# Patient Record
Sex: Male | Born: 1995 | Race: Black or African American | Hispanic: No | Marital: Single | Smoking: Current every day smoker
Health system: Southern US, Community
[De-identification: ages and names within clinical notes are randomized; demographics above are authoritative.]

---

## 2018-06-25 ENCOUNTER — Emergency Department
Admission: EM | Admit: 2018-06-25 | Discharge: 2018-06-25 | Disposition: A | Discharge status: Home / Self Care | Payer: HRSA Program | Attending: Emergency Medicine | Admitting: Emergency Medicine

## 2018-06-25 ENCOUNTER — Other Ambulatory Visit: Payer: Self-pay

## 2018-06-25 ENCOUNTER — Encounter: Payer: Self-pay | Admitting: *Deleted

## 2018-06-25 DIAGNOSIS — F17200 Nicotine dependence, unspecified, uncomplicated: Secondary | ICD-10-CM | POA: Diagnosis not present

## 2018-06-25 DIAGNOSIS — Z139 Encounter for screening, unspecified: Secondary | ICD-10-CM

## 2018-06-25 DIAGNOSIS — Z03818 Encounter for observation for suspected exposure to other biological agents ruled out: Secondary | ICD-10-CM | POA: Insufficient documentation

## 2018-06-25 LAB — SARS CORONAVIRUS 2 BY RT PCR (HOSPITAL ORDER, PERFORMED IN ~~LOC~~ HOSPITAL LAB): SARS Coronavirus 2: NEGATIVE

## 2018-06-25 NOTE — ED Triage Notes (Signed)
Pt reports being in contact with a positive covid pt.  Pt want to be checked.  Pt reports no fever or cough.  Pt alert and ambulatory to room

## 2018-06-27 NOTE — ED Provider Notes (Signed)
[facility] Emergency Department Provider Note    First MD Initiated Contact with Patient 06/25/18 1559     (approximate)  I have reviewed the triage vital signs and the nursing notes.   HISTORY  Chief Complaint Cough    HPI Thomas Roberson is a 23 y.o. male presents to the emergency department with request for testing for COVID-19.  Patient states that he was in contact with someone that was positive for COVID-19 and that his job is requiring him to be tested before he can return to work.  Patient denies any cough no fever.  Patient denies any shortness of breath.  Patient denies any complaints at all.        No past medical history on file.  There are no active problems to display for this patient.    Prior to Admission medications   Not on File    Allergies Patient has no known allergies.  No family history on file.  Social History Social History   Tobacco Use  . Smoking status: Current Every Day Smoker  . Smokeless tobacco: Never Used  Substance Use Topics  . Alcohol use: Yes  . Drug use: Not Currently    Review of Systems Constitutional: No fever/chills Eyes: No visual changes. ENT: No sore throat. Cardiovascular: Denies chest pain. Respiratory: Denies shortness of breath. Gastrointestinal: No abdominal pain.  No nausea, no vomiting.  No diarrhea.  No constipation. Genitourinary: Negative for dysuria. Musculoskeletal: Negative for neck pain.  Negative for back pain. Integumentary: Negative for rash. Neurological: Negative for headaches, focal weakness or numbness.  ____________________________________________   PHYSICAL EXAM:  VITAL SIGNS: ED Triage Vitals  Enc Vitals Group     BP 06/25/18 1635 127/75     Pulse Rate 06/25/18 1635 (!) 59     Resp 06/25/18 1635 17     Temp 06/25/18 1635 98.6 F (37 C)     Temp Source 06/25/18 1814 Oral     SpO2 06/25/18 1635 100 %     Weight 06/25/18 1533 113.4 kg (250 lb)   Height 06/25/18 1533 1.702 m (5\' 7" )     Head Circumference --      Peak Flow --      Pain Score 06/25/18 1814 0     Pain Loc --      Pain Edu? --      Excl. in [place]? --     Constitutional: Alert and oriented. Well appearing and in no acute distress. Eyes: Conjunctivae are normal. Nose: No congestion/rhinnorhea. Mouth/Throat: Mucous membranes are moist.  Oropharynx non-erythematous. Neck: No stridor.  Cardiovascular: Normal rate, regular rhythm. Good peripheral circulation. Grossly normal heart sounds. Respiratory: Normal respiratory effort.  No retractions. No audible wheezing. Gastrointestinal: Soft and nontender. No distention.  Musculoskeletal: No lower extremity tenderness nor edema. No gross deformities of extremities. Neurologic:  Normal speech and language. No gross focal neurologic deficits are appreciated.  Skin:  Skin is warm, dry and intact. No rash noted. Psychiatric: Mood and affect are normal. Speech and behavior are normal.  ____________________________________________   LABS (all labs ordered are listed, but only abnormal results are displayed)  Labs Reviewed  SARS CORONAVIRUS 2 (HOSPITAL ORDER, PERFORMED IN [facility])     Procedures   ____________________________________________   INITIAL IMPRESSION / MDM / ASSESSMENT AND PLAN / ED COURSE  As part of my medical decision making, I reviewed the following data within the electronic MEDICAL RECORD NUMBER   23 year old male  with request for COVID-19 testing secondary to his job's request.  Patient tested negative for COVID-19.  *Thomas Roberson was evaluated in Emergency Department on 06/27/2018 for the symptoms described in the history of present illness. He was evaluated in the context of the global COVID-19 pandemic, which necessitated consideration that the patient might be at risk for infection with the SARS-CoV-2 virus that causes COVID-19. Institutional protocols and algorithms that pertain to the  evaluation of patients at risk for COVID-19 are in a state of rapid change based on information released by regulatory bodies including the CDC and federal and state organizations. These policies and algorithms were followed during the patient's care in the ED.  Some ED evaluations and interventions may be delayed as a result of limited staffing during the pandemic.*    ____________________________________________  FINAL CLINICAL IMPRESSION(S) / ED DIAGNOSES  Final diagnoses:  Encounter for medical screening examination     MEDICATIONS GIVEN DURING THIS VISIT:  Medications - No data to display   ED Discharge Orders    None       Note:  This document was prepared using Dragon voice recognition software and may include unintentional dictation errors.   Darci CurrentBrown, [place] N, MD 06/27/18 [phone]

## 2018-09-10 ENCOUNTER — Other Ambulatory Visit: Payer: Self-pay

## 2018-09-10 DIAGNOSIS — Z20822 Contact with and (suspected) exposure to covid-19: Secondary | ICD-10-CM

## 2018-09-11 LAB — NOVEL CORONAVIRUS, NAA: SARS-CoV-2, NAA: NOT DETECTED

## 2019-01-01 ENCOUNTER — Emergency Department
Admission: EM | Admit: 2019-01-01 | Discharge: 2019-01-01 | Disposition: A | Discharge status: Home / Self Care | Payer: Self-pay | Attending: Emergency Medicine | Admitting: Emergency Medicine

## 2019-01-01 ENCOUNTER — Other Ambulatory Visit: Payer: Self-pay

## 2019-01-01 DIAGNOSIS — H1033 Unspecified acute conjunctivitis, bilateral: Secondary | ICD-10-CM | POA: Insufficient documentation

## 2019-01-01 DIAGNOSIS — F17210 Nicotine dependence, cigarettes, uncomplicated: Secondary | ICD-10-CM | POA: Insufficient documentation

## 2019-01-01 MED ORDER — TETRACAINE HCL 0.5 % OP SOLN
1.0000 [drp] | Freq: Once | OPHTHALMIC | Status: AC
  Administered 2019-01-01: 1 [drp] via OPHTHALMIC
  Filled 2019-01-01: qty 4

## 2019-01-01 MED ORDER — FLUORESCEIN SODIUM 1 MG OP STRP
1.0000 | ORAL_STRIP | Freq: Once | OPHTHALMIC | Status: AC
  Administered 2019-01-01: 1 via OPHTHALMIC
  Filled 2019-01-01: qty 1

## 2019-01-01 MED ORDER — GENTAMICIN SULFATE 0.3 % OP SOLN: 1.0000 [drp] | Freq: Four times a day (QID) | OPHTHALMIC | 0 refills | Status: AC

## 2019-01-01 NOTE — ED Triage Notes (Signed)
Pt states he was seen here recently for an pink eye. Pt states he was then prescribed medication that caused a reaction.   Pt then prescribed different medication and advised to come to ER if not better the patient states it is not better and his vision is blurry.  Pt has medications in hand.

## 2019-01-01 NOTE — ED Provider Notes (Signed)
Emergency Department Provider Note  ____________________________________________  Time seen: Approximately 3:27 PM  I have reviewed the triage vital signs and the nursing notes.   HISTORY  Chief Complaint Eye Pain   Historian Patient     HPI Thomas Roberson is a 23 y.o. male presents to the emergency department with concern for bilateral conjunctivitis that has occurred for the past 5 to 6 days.  Patient reports that his girlfriend had pinkeye and then he developed symptoms in 1 eye which spread to both eyes.  He initially started taking ophthalmic antibiotic eyedrops that his girlfriend was prescribed but states that he thought his symptoms were worsening so he stopped.  He saw his primary care provider who diagnosed him with allergic conjunctivitis and started him on Pataday and Claritin.  Patient reports that his eye redness has improved but he still has a gritty sensation and is waking up with crusting in his eyelashes and eyelids.  He states that he has occasional blurry vision in his eye is crusted over.  No nausea or vomiting.  He states he has a history of strabismus but no other eye issues.  He does not wear contact lenses but does wear glasses.  No associated rhinorrhea, nasal congestion or nonproductive cough.   History reviewed. No pertinent past medical history.   Immunizations up to date:  Yes.     History reviewed. No pertinent past medical history.  There are no active problems to display for this patient.   History reviewed. No pertinent surgical history.  Prior to Admission medications   Medication Sig Start Date End Date Taking? Authorizing Provider  gentamicin (GARAMYCIN) 0.3 % ophthalmic solution Place 1 drop into both eyes 4 (four) times daily for 10 days. 01/01/19 01/11/19  Lannie Fields, PA-C    Allergies Patient has no known allergies.  No family history on file.  Social History Social History   Tobacco Use  . Smoking status: Current Every  Day Smoker  . Smokeless tobacco: Never Used  Substance Use Topics  . Alcohol use: Yes  . Drug use: Not Currently     Review of Systems  Constitutional: No fever/chills Eyes: Patient has bilateral conjunctivitis.  ENT: No upper respiratory complaints. Respiratory: no cough. No SOB/ use of accessory muscles to breath Gastrointestinal:   No nausea, no vomiting.  No diarrhea.  No constipation. Musculoskeletal: Negative for musculoskeletal pain. Skin: Negative for rash, abrasions, lacerations, ecchymosis.    ____________________________________________   PHYSICAL EXAM:  VITAL SIGNS: ED Triage Vitals  Enc Vitals Group     BP 01/01/19 1352 (!) 144/76     Pulse Rate 01/01/19 1352 65     Resp 01/01/19 1352 20     Temp 01/01/19 1352 98.3 F (36.8 C)     Temp Source 01/01/19 1352 Oral     SpO2 01/01/19 1352 99 %     Weight 01/01/19 1353 270 lb (122.5 kg)     Height 01/01/19 1353 5\' 8"  (1.727 m)     Head Circumference --      Peak Flow --      Pain Score 01/01/19 1403 0     Pain Loc --      Pain Edu? --      Excl. in [place]? --      Constitutional: Alert and oriented. Well appearing and in no acute distress. Eyes: Patient has conjunctivitis visualized bilaterally with no perilimbal redness.  No fluorescein uptake was visualized on exam.  PERRL. EOMI. Head: Atraumatic.  ENT:      Nose: No congestion/rhinnorhea.      Mouth/Throat: Mucous membranes are moist.   Cardiovascular: Normal rate, regular rhythm. Normal S1 and S2.  Good peripheral circulation. Respiratory: Normal respiratory effort without tachypnea or retractions. Lungs CTAB. Good air entry to the bases with no decreased or absent breath sounds Skin:  Skin is warm, dry and intact. No rash noted. Psychiatric: Mood and affect are normal for age. Speech and behavior are normal.   ____________________________________________   LABS (all labs ordered are listed, but only abnormal results are displayed)  Labs Reviewed -  No data to display ____________________________________________  EKG   ____________________________________________  RADIOLOGY   No results found.  ____________________________________________    PROCEDURES  Procedure(s) performed:     Procedures     Medications  fluorescein ophthalmic strip 1 strip (1 strip Right Eye Given by Other 01/01/19 1516)  tetracaine (PONTOCAINE) 0.5 % ophthalmic solution 1 drop (1 drop Both Eyes Given by Other 01/01/19 1516)     ____________________________________________   INITIAL IMPRESSION / ASSESSMENT AND PLAN / ED COURSE  Pertinent labs & imaging results that were available during my care of the patient were reviewed by me and considered in my medical decision making (see chart for details).      Assessment and plan Conjunctivitis 23 year old male presents to the emergency department with bilateral conjunctivitis that is occurred for the past 5 to 6 days.  Vital signs were reassuring at discharge.  On initial physical exam, patient stated that he had difficulty opening his eyes.  I went to obtain fluorescein and tetracaine and when I return, patient had both his eyes open without difficulty.  Fluorescein staining did not reveal any evidence of uptake.  He had some mild conjunctivitis visualized bilaterally without perilimbal redness.  Pupils were reactive bilaterally and there was no evidence of allergic chemosis or cobblestoning of the lower eyelids.  I recommended follow-up with ophthalmology and started patient on gentamicin as he is not completed a course of antibiotics prescribed for him.  He voiced understanding and states that he would follow-up with ophthalmology as directed.  Return precautions were given.  All patient questions were answered.    ____________________________________________  FINAL CLINICAL IMPRESSION(S) / ED DIAGNOSES  Final diagnoses:  Acute conjunctivitis of both eyes, unspecified acute conjunctivitis  type      NEW MEDICATIONS STARTED DURING THIS VISIT:  ED Discharge Orders         Ordered    gentamicin (GARAMYCIN) 0.3 % ophthalmic solution  4 times daily     01/01/19 1522              This chart was dictated using voice recognition software/Dragon. Despite best efforts to proofread, errors can occur which can change the meaning. Any change was purely unintentional.     Orvil Feil, PA-C 01/01/19 1614    Arnaldo Natal, MD 01/01/19 [phone]

## 2019-02-05 ENCOUNTER — Ambulatory Visit: Payer: Medicaid Other | Attending: Internal Medicine

## 2019-02-05 DIAGNOSIS — Z20822 Contact with and (suspected) exposure to covid-19: Secondary | ICD-10-CM

## 2019-02-06 LAB — NOVEL CORONAVIRUS, NAA: SARS-CoV-2, NAA: NOT DETECTED

## 2019-02-11 ENCOUNTER — Other Ambulatory Visit: Payer: Medicaid Other

## 2019-03-06 ENCOUNTER — Other Ambulatory Visit: Payer: Medicaid Other

## 2019-04-09 ENCOUNTER — Emergency Department
Admission: EM | Admit: 2019-04-09 | Discharge: 2019-04-09 | Disposition: A | Discharge status: Home / Self Care | Payer: Medicaid Other | Attending: Emergency Medicine | Admitting: Emergency Medicine

## 2019-04-09 ENCOUNTER — Encounter: Payer: Self-pay | Admitting: Emergency Medicine

## 2019-04-09 ENCOUNTER — Emergency Department: Payer: Medicaid Other

## 2019-04-09 ENCOUNTER — Other Ambulatory Visit: Payer: Self-pay

## 2019-04-09 DIAGNOSIS — R05 Cough: Secondary | ICD-10-CM | POA: Insufficient documentation

## 2019-04-09 DIAGNOSIS — Z20822 Contact with and (suspected) exposure to covid-19: Secondary | ICD-10-CM | POA: Insufficient documentation

## 2019-04-09 DIAGNOSIS — F17210 Nicotine dependence, cigarettes, uncomplicated: Secondary | ICD-10-CM | POA: Insufficient documentation

## 2019-04-09 DIAGNOSIS — K2100 Gastro-esophageal reflux disease with esophagitis, without bleeding: Secondary | ICD-10-CM

## 2019-04-09 DIAGNOSIS — R0789 Other chest pain: Secondary | ICD-10-CM

## 2019-04-09 DIAGNOSIS — R110 Nausea: Secondary | ICD-10-CM | POA: Insufficient documentation

## 2019-04-09 DIAGNOSIS — K219 Gastro-esophageal reflux disease without esophagitis: Secondary | ICD-10-CM | POA: Insufficient documentation

## 2019-04-09 LAB — CBC
HCT: 43.8 % (ref 39.0–52.0)
Hemoglobin: 13.9 g/dL (ref 13.0–17.0)
MCH: 23.7 pg — ABNORMAL LOW (ref 26.0–34.0)
MCHC: 31.7 g/dL (ref 30.0–36.0)
MCV: 74.6 fL — ABNORMAL LOW (ref 80.0–100.0)
Platelets: 441 10*3/uL — ABNORMAL HIGH (ref 150–400)
RBC: 5.87 MIL/uL — ABNORMAL HIGH (ref 4.22–5.81)
RDW: 14.5 % (ref 11.5–15.5)
WBC: 5.1 10*3/uL (ref 4.0–10.5)
nRBC: 0 % (ref 0.0–0.2)

## 2019-04-09 LAB — BASIC METABOLIC PANEL
Anion gap: 7 (ref 5–15)
BUN: 14 mg/dL (ref 6–20)
CO2: 26 mmol/L (ref 22–32)
Calcium: 9.4 mg/dL (ref 8.9–10.3)
Chloride: 106 mmol/L (ref 98–111)
Creatinine, Ser: 1 mg/dL (ref 0.61–1.24)
GFR calc Af Amer: >60 mL/min (ref >60)
GFR calc non Af Amer: >60 mL/min (ref >60)
Glucose, Bld: 105 mg/dL — ABNORMAL HIGH (ref 70–99)
Potassium: 4 mmol/L (ref 3.5–5.1)
Sodium: 139 mmol/L (ref 135–145)

## 2019-04-09 LAB — TROPONIN I (HIGH SENSITIVITY): Troponin I (High Sensitivity): <2 ng/L (ref <18)

## 2019-04-09 MED ORDER — ALUM & MAG HYDROXIDE-SIMETH 200-200-20 MG/5ML PO SUSP
30.0000 mL | Freq: Once | ORAL | Status: AC
  Administered 2019-04-09: 22:00:00 30 mL via ORAL
  Filled 2019-04-09: qty 30

## 2019-04-09 MED ORDER — LIDOCAINE VISCOUS HCL 2 % MT SOLN
15.0000 mL | Freq: Once | OROMUCOSAL | Status: AC
  Administered 2019-04-09: 15 mL via ORAL
  Filled 2019-04-09: qty 15

## 2019-04-09 MED ORDER — PANTOPRAZOLE SODIUM 20 MG PO TBEC: 20.0000 mg | DELAYED_RELEASE_TABLET | Freq: Every day | ORAL | 1 refills | Status: DC

## 2019-04-09 MED ORDER — ONDANSETRON 4 MG PO TBDP
4.0000 mg | ORAL_TABLET | Freq: Once | ORAL | Status: AC
  Administered 2019-04-09: 22:00:00 4 mg via ORAL
  Filled 2019-04-09: qty 1

## 2019-04-09 NOTE — ED Provider Notes (Signed)
[facility] Emergency Department Provider Note   ____________________________________________    I have reviewed the triage vital signs and the nursing notes.   HISTORY  Chief Complaint Chest Pain     HPI Thomas Roberson is a 24 y.o. male with no past medical history who presents with complaints of intermittent cough, occasional nausea, discomfort in his anterior chest.  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  He reports he feels nauseated and has a bad taste in his mouth.  He states his girlfriend is concerned that he has Covid with the flu and needs an x-ray and Covid testing.  He has not take anything for this.  Denies fevers or chills.  No shortness of breath  History reviewed. No pertinent past medical history.  There are no problems to display for this patient.   History reviewed. No pertinent surgical history.  Prior to Admission medications   Medication Sig Start Date End Date Taking? Authorizing Provider  pantoprazole (PROTONIX) 20 MG tablet Take 1 tablet (20 mg total) by mouth daily. 04/09/19 04/08/20  Lavonia Drafts, MD     Allergies Patient has no known allergies.  No family history on file.  Social History Social History   Tobacco Use  . Smoking status: Current Every Day Smoker    Packs/day: 0.25    Types: Cigarettes  . Smokeless tobacco: Never Used  Substance Use Topics  . Alcohol use: Yes  . Drug use: Yes    Types: Marijuana    Review of Systems  Constitutional: No fever/chills  ENT: As above  Cardiovascular: As above Gastrointestinal: No abdominal pain.     Musculoskeletal: Negative for myalgias Skin: Negative for rash.     ____________________________________________   PHYSICAL EXAM:  VITAL SIGNS: ED Triage Vitals  Enc Vitals Group     BP 04/09/19 2034 127/73     Pulse Rate 04/09/19 2034 66     Resp 04/09/19 2034 18     Temp 04/09/19 2034 98.7 F  (37.1 C)     Temp Source 04/09/19 2034 Oral     SpO2 04/09/19 2034 99 %     Weight 04/09/19 2032 131.5 kg (290 lb)     Height 04/09/19 2032 1.727 m (5\' 8" )     Head Circumference --      Peak Flow --      Pain Score 04/09/19 2031 7     Pain Loc --      Pain Edu? --      Excl. in [place]? --      Constitutional: Alert and oriented. No acute distress.  Nose: No congestion/rhinnorhea. Mouth/Throat: Mucous membranes are moist.   Cardiovascular: Normal rate, regular rhythm.  No chest wall tenderness palpation Respiratory: Normal respiratory effort.  No retractions.  Clear to auscultation bilaterally  Musculoskeletal: No lower extremity tenderness nor edema.   Neurologic:  Normal speech and language. No gross focal neurologic deficits are appreciated.   Skin:  Skin is warm, dry and intact. No rash noted.   ____________________________________________   LABS (all labs ordered are listed, but only abnormal results are displayed)  Labs Reviewed  BASIC METABOLIC PANEL - Abnormal; Notable for the following components:      Result Value   Glucose, Bld 105 (*)    All other components within normal limits  CBC - Abnormal; Notable for the following components:   RBC 5.87 (*)  MCV 74.6 (*)    MCH 23.7 (*)    Platelets 441 (*)    All other components within normal limits  SARS CORONAVIRUS 2 (TAT 6-24 HRS)  TROPONIN I (HIGH SENSITIVITY)   ____________________________________________  EKG  ED ECG REPORT I, Jene Every, the attending physician, personally viewed and interpreted this ECG.  Date: 04/09/2019  Rhythm: normal sinus rhythm QRS Axis: normal Intervals: normal ST/T Wave abnormalities: normal Narrative Interpretation: no evidence of acute ischemia  ____________________________________________  RADIOLOGY  Chest x-ray unremarkable, viewed by me, no pneumothorax, no pneumonia ____________________________________________   PROCEDURES  Procedure(s) performed:  No  Procedures   Critical Care performed: No ____________________________________________   INITIAL IMPRESSION / ASSESSMENT AND PLAN / ED COURSE  Pertinent labs & imaging results that were available during my care of the patient were reviewed by me and considered in my medical decision making (see chart for details).  Patient well-appearing in no acute distress, strongly suspect GERD as the cause of his symptoms given taste in the back of the mouth and nausea.  Will treat with Zofran, GI cocktail.  Lab work is quite reassuring, no white blood cell, normal hemoglobin, chemistries are unremarkable.  Troponin is normal.  Chest x-ray is clear.  After GI cocktail patient had complete resolution of discomfort and nausea.  We will start him on Protonix recommend outpatient follow-up as needed.   ____________________________________________   FINAL CLINICAL IMPRESSION(S) / ED DIAGNOSES  Final diagnoses:  Atypical chest pain  Gastroesophageal reflux disease with esophagitis without hemorrhage      NEW MEDICATIONS STARTED DURING THIS VISIT:  Discharge Medication List as of 04/09/2019 10:34 PM    START taking these medications   Details  pantoprazole (PROTONIX) 20 MG tablet Take 1 tablet (20 mg total) by mouth daily., Starting Wed 04/09/2019, Until Thu 04/08/2020, Normal         Note:  This document was prepared using Dragon voice recognition software and may include unintentional dictation errors.   Jene Every, MD 04/09/19 2248

## 2019-04-09 NOTE — ED Triage Notes (Signed)
Pt in via POV, reports intermittent generalized chest pain w/ shortness of breath and nausea since January.  Ambulatory to triage, NAD noted at this time.

## 2019-04-09 NOTE — ED Notes (Signed)
ED Provider at bedside. 

## 2019-04-10 LAB — SARS CORONAVIRUS 2 (TAT 6-24 HRS): SARS Coronavirus 2: NEGATIVE

## 2019-04-30 ENCOUNTER — Emergency Department: Admission: EM | Admit: 2019-04-30 | Payer: Medicaid Other | Source: Home / Self Care

## 2019-04-30 ENCOUNTER — Other Ambulatory Visit: Payer: Self-pay

## 2019-05-08 ENCOUNTER — Emergency Department: Admission: EM | Admit: 2019-05-08 | Payer: Medicaid Other | Source: Home / Self Care

## 2019-05-10 ENCOUNTER — Other Ambulatory Visit: Payer: Self-pay

## 2019-05-10 ENCOUNTER — Emergency Department: Payer: Self-pay

## 2019-05-10 ENCOUNTER — Emergency Department
Admission: EM | Admit: 2019-05-10 | Discharge: 2019-05-10 | Disposition: A | Discharge status: Home / Self Care | Payer: Self-pay | Attending: Student in an Organized Health Care Education/Training Program | Admitting: Student in an Organized Health Care Education/Training Program

## 2019-05-10 ENCOUNTER — Encounter: Payer: Self-pay | Admitting: Emergency Medicine

## 2019-05-10 DIAGNOSIS — R070 Pain in throat: Secondary | ICD-10-CM | POA: Insufficient documentation

## 2019-05-10 DIAGNOSIS — R6883 Chills (without fever): Secondary | ICD-10-CM | POA: Insufficient documentation

## 2019-05-10 DIAGNOSIS — F17210 Nicotine dependence, cigarettes, uncomplicated: Secondary | ICD-10-CM | POA: Insufficient documentation

## 2019-05-10 DIAGNOSIS — R197 Diarrhea, unspecified: Secondary | ICD-10-CM | POA: Insufficient documentation

## 2019-05-10 DIAGNOSIS — R1013 Epigastric pain: Secondary | ICD-10-CM | POA: Insufficient documentation

## 2019-05-10 LAB — URINALYSIS, COMPLETE (UACMP) WITH MICROSCOPIC
Bacteria, UA: NONE SEEN
Bilirubin Urine: NEGATIVE
Glucose, UA: NEGATIVE mg/dL
Hgb urine dipstick: NEGATIVE
Ketones, ur: NEGATIVE mg/dL
Leukocytes,Ua: NEGATIVE
Nitrite: NEGATIVE
Protein, ur: NEGATIVE mg/dL
Specific Gravity, Urine: 1.023 (ref 1.005–1.030)
Squamous Epithelial / HPF: NONE SEEN (ref 0–5)
pH: 6 (ref 5.0–8.0)

## 2019-05-10 LAB — COMPREHENSIVE METABOLIC PANEL
ALT: 60 U/L — ABNORMAL HIGH (ref 0–44)
AST: 38 U/L (ref 15–41)
Albumin: 4.1 g/dL (ref 3.5–5.0)
Alkaline Phosphatase: 64 U/L (ref 38–126)
Anion gap: 7 (ref 5–15)
BUN: 11 mg/dL (ref 6–20)
CO2: 24 mmol/L (ref 22–32)
Calcium: 8.9 mg/dL (ref 8.9–10.3)
Chloride: 104 mmol/L (ref 98–111)
Creatinine, Ser: 0.96 mg/dL (ref 0.61–1.24)
GFR calc Af Amer: >60 mL/min (ref >60)
GFR calc non Af Amer: >60 mL/min (ref >60)
Glucose, Bld: 118 mg/dL — ABNORMAL HIGH (ref 70–99)
Potassium: 3.5 mmol/L (ref 3.5–5.1)
Sodium: 135 mmol/L (ref 135–145)
Total Bilirubin: 0.6 mg/dL (ref 0.3–1.2)
Total Protein: 7.1 g/dL (ref 6.5–8.1)

## 2019-05-10 LAB — CBC
HCT: 42 % (ref 39.0–52.0)
Hemoglobin: 13.2 g/dL (ref 13.0–17.0)
MCH: 23.4 pg — ABNORMAL LOW (ref 26.0–34.0)
MCHC: 31.4 g/dL (ref 30.0–36.0)
MCV: 74.6 fL — ABNORMAL LOW (ref 80.0–100.0)
Platelets: 464 10*3/uL — ABNORMAL HIGH (ref 150–400)
RBC: 5.63 MIL/uL (ref 4.22–5.81)
RDW: 14.7 % (ref 11.5–15.5)
WBC: 5.9 10*3/uL (ref 4.0–10.5)
nRBC: 0 % (ref 0.0–0.2)

## 2019-05-10 LAB — LIPASE, BLOOD: Lipase: 19 U/L (ref 11–51)

## 2019-05-10 MED ORDER — PANTOPRAZOLE SODIUM 40 MG PO TBEC
40.0000 mg | DELAYED_RELEASE_TABLET | Freq: Once | ORAL | Status: AC
  Administered 2019-05-10: 11:00:00 40 mg via ORAL
  Filled 2019-05-10: qty 1

## 2019-05-10 MED ORDER — SODIUM CHLORIDE 0.9% FLUSH: 3.0000 mL | Freq: Once | INTRAVENOUS | Status: DC

## 2019-05-10 MED ORDER — PANTOPRAZOLE SODIUM 20 MG PO TBEC: 20.0000 mg | DELAYED_RELEASE_TABLET | Freq: Every day | ORAL | 1 refills | Status: AC

## 2019-05-10 NOTE — ED Notes (Signed)
Dr Roxan Hockey gave pt discharge papers and allowed pt to leave

## 2019-05-10 NOTE — Discharge Instructions (Signed)

## 2019-05-10 NOTE — ED Provider Notes (Signed)
[facility] Emergency Department Provider Note    First MD Initiated Contact with Patient 05/10/19 1053     (approximate)  I have reviewed the triage vital signs and the nursing notes.   HISTORY  Chief Complaint Abdominal Pain    HPI Thomas Roberson is a 24 y.o. male no significant past medical history other than occasional smoking history does present to the ER for several days of progressively worsening epigastric and right upper quadrant pain.  Pain is worsened after eating.  States he does have pain going up into his throat.  Has had some chills but no measured fevers.  States is also having loose stool and diarrhea.  No recent antibiotics.  Has not noticed any melena or hematochezia.  Not on any blood thinners.    History reviewed. No pertinent past medical history. History reviewed. No pertinent family history. History reviewed. No pertinent surgical history. There are no problems to display for this patient.     Prior to Admission medications   Medication Sig Start Date End Date Taking? Authorizing Provider  pantoprazole (PROTONIX) 20 MG tablet Take 1 tablet (20 mg total) by mouth daily. 05/10/19 05/09/20  Merlyn Lot, MD    Allergies Patient has no known allergies.    Social History Social History   Tobacco Use  . Smoking status: Current Every Day Smoker    Packs/day: 0.25    Types: Cigarettes  . Smokeless tobacco: Never Used  Substance Use Topics  . Alcohol use: Yes  . Drug use: Yes    Types: Marijuana    Review of Systems Patient denies headaches, rhinorrhea, blurry vision, numbness, shortness of breath, chest pain, edema, cough, abdominal pain, nausea, vomiting, diarrhea, dysuria, fevers, rashes or hallucinations unless otherwise stated above in HPI. ____________________________________________   PHYSICAL EXAM:  VITAL SIGNS: Vitals:   05/10/19 0925  BP: 122/73  Pulse: 82  Resp: 16  Temp: 99 F (37.2 C)  SpO2: 98%      Constitutional: Alert and oriented.  Eyes: Conjunctivae are normal.  Head: Atraumatic. Nose: No congestion/rhinnorhea. Mouth/Throat: Mucous membranes are moist.   Neck: No stridor. Painless ROM.  Cardiovascular: Normal rate, regular rhythm. Grossly normal heart sounds.  Good peripheral circulation. Respiratory: Normal respiratory effort.  No retractions. Lungs CTAB. Gastrointestinal: Soft and nontender. No distention. No abdominal bruits. No CVA tenderness. Genitourinary:  Musculoskeletal: No lower extremity tenderness nor edema.  No joint effusions. Neurologic:  Normal speech and language. No gross focal neurologic deficits are appreciated. No facial droop Skin:  Skin is warm, dry and intact. No rash noted. Psychiatric: Mood and affect are normal. Speech and behavior are normal.  ____________________________________________   LABS (all labs ordered are listed, but only abnormal results are displayed)  Results for orders placed or performed during the hospital encounter of 05/10/19 (from the past 24 hour(s))  Lipase, blood     Status: None   Collection Time: 05/10/19  9:30 AM  Result Value Ref Range   Lipase 19 11 - 51 U/L  Comprehensive metabolic panel     Status: Abnormal   Collection Time: 05/10/19  9:30 AM  Result Value Ref Range   Sodium 135 135 - 145 mmol/L   Potassium 3.5 3.5 - 5.1 mmol/L   Chloride 104 98 - 111 mmol/L   CO2 24 22 - 32 mmol/L   Glucose, Bld 118 (H) 70 - 99 mg/dL   BUN 11 6 - 20 mg/dL   Creatinine, Ser 0.96 0.61 - 1.24  mg/dL   Calcium 8.9 8.9 - 88.9 mg/dL   Total Protein 7.1 6.5 - 8.1 g/dL   Albumin 4.1 3.5 - 5.0 g/dL   AST 38 15 - 41 U/L   ALT 60 (H) 0 - 44 U/L   Alkaline Phosphatase 64 38 - 126 U/L   Total Bilirubin 0.6 0.3 - 1.2 mg/dL   GFR calc non Af Amer >60 >60 mL/min   GFR calc Af Amer >60 >60 mL/min   Anion gap 7 5 - 15  CBC     Status: Abnormal   Collection Time: 05/10/19  9:30 AM  Result Value Ref Range   WBC 5.9 4.0 - 10.5 K/uL    RBC 5.63 4.22 - 5.81 MIL/uL   Hemoglobin 13.2 13.0 - 17.0 g/dL   HCT 16.9 45.0 - 38.8 %   MCV 74.6 (L) 80.0 - 100.0 fL   MCH 23.4 (L) 26.0 - 34.0 pg   MCHC 31.4 30.0 - 36.0 g/dL   RDW 82.8 00.3 - 49.1 %   Platelets 464 (H) 150 - 400 K/uL   nRBC 0.0 0.0 - 0.2 %  Urinalysis, Complete w Microscopic     Status: Abnormal   Collection Time: 05/10/19 12:38 PM  Result Value Ref Range   Color, Urine YELLOW (A) YELLOW   APPearance HAZY (A) CLEAR   Specific Gravity, Urine 1.023 1.005 - 1.030   pH 6.0 5.0 - 8.0   Glucose, UA NEGATIVE NEGATIVE mg/dL   Hgb urine dipstick NEGATIVE NEGATIVE   Bilirubin Urine NEGATIVE NEGATIVE   Ketones, ur NEGATIVE NEGATIVE mg/dL   Protein, ur NEGATIVE NEGATIVE mg/dL   Nitrite NEGATIVE NEGATIVE   Leukocytes,Ua NEGATIVE NEGATIVE   RBC / HPF 0-5 0 - 5 RBC/hpf   WBC, UA 0-5 0 - 5 WBC/hpf   Bacteria, UA NONE SEEN NONE SEEN   Squamous Epithelial / LPF NONE SEEN 0 - 5   Mucus PRESENT    ____________________________________________ ____________________________________________  RADIOLOGY  I personally reviewed all radiographic images ordered to evaluate for the above acute complaints and reviewed radiology reports and findings.  These findings were personally discussed with the patient.  Please see medical record for radiology report.  ____________________________________________   PROCEDURES  Procedure(s) performed:  Procedures    Critical Care performed: no ____________________________________________   INITIAL IMPRESSION / ASSESSMENT AND PLAN / ED COURSE  Pertinent labs & imaging results that were available during my care of the patient were reviewed by me and considered in my medical decision making (see chart for details).   DDX: Gastritis, enteritis, cholelithiasis, cholecystitis, pancreatitis, colitis, ibs, ibd  Thomas Roberson is a 24 y.o. who presents to the ED with symptoms as described above.  Patient nontoxic-appearing well-appearing in no  acute distress.  No white count.  Hemoglobin stable.  Borderline elevated LFT.  Denies any alcohol use.  Lipase is normal.  Denies any chest pain or shortness of breath.  I suspect progression of reflux possible PUD.  Given location of discomfort mildly elevated ALT and pain related to eating will order right upper quadrant ultrasound to exclude biliary pathology. Clinical Course as of May 09 1348  Sat May 10, 2019  1257 Ultrasound did note incidental possible right pleural effusion will order x-ray.  He is not having any shortness of breath but could be having some referred discomfort from pneumonia.  Doubt chf acs or pe.   [PR]  1345 Imaging and repeat exam is reassuring.  Patient appropriate for outpatient follow-up.   [  PR]    Clinical Course User Index [PR] Willy Eddy, MD    The patient was evaluated in Emergency Department today for the symptoms described in the history of present illness. He/she was evaluated in the context of the global COVID-19 pandemic, which necessitated consideration that the patient might be at risk for infection with the SARS-CoV-2 virus that causes COVID-19. Institutional protocols and algorithms that pertain to the evaluation of patients at risk for COVID-19 are in a state of rapid change based on information released by regulatory bodies including the CDC and federal and state organizations. These policies and algorithms were followed during the patient's care in the ED.  As part of my medical decision making, I reviewed the following data within the electronic MEDICAL RECORD NUMBER Nursing notes reviewed and incorporated, Labs reviewed, notes from prior ED visits and [place] Controlled Substance Database   ____________________________________________   FINAL CLINICAL IMPRESSION(S) / ED DIAGNOSES  Final diagnoses:  Epigastric pain      NEW MEDICATIONS STARTED DURING THIS VISIT:  Current Discharge Medication List       Note:  This document was prepared  using Dragon voice recognition software and may include unintentional dictation errors.    Willy Eddy, MD 05/10/19 1350

## 2019-05-10 NOTE — ED Triage Notes (Signed)
Pt to ER with c/o abdominal pain, loose stools and nausea for the last week or more.  PT denies changes today.

## 2019-05-10 NOTE — ED Notes (Signed)
Pt transported to US

## 2021-10-06 IMAGING — CR DG CHEST 2V
1 series · 2 of 2 positions shown · non-contrast
Comparison: None.

CLINICAL DATA: Chest pain

EXAM:
CHEST - 2 VIEW

[Series 1: dg chest 2 view · 0.14mm/px · 2 of 2 slices shown]
[im 1/2]
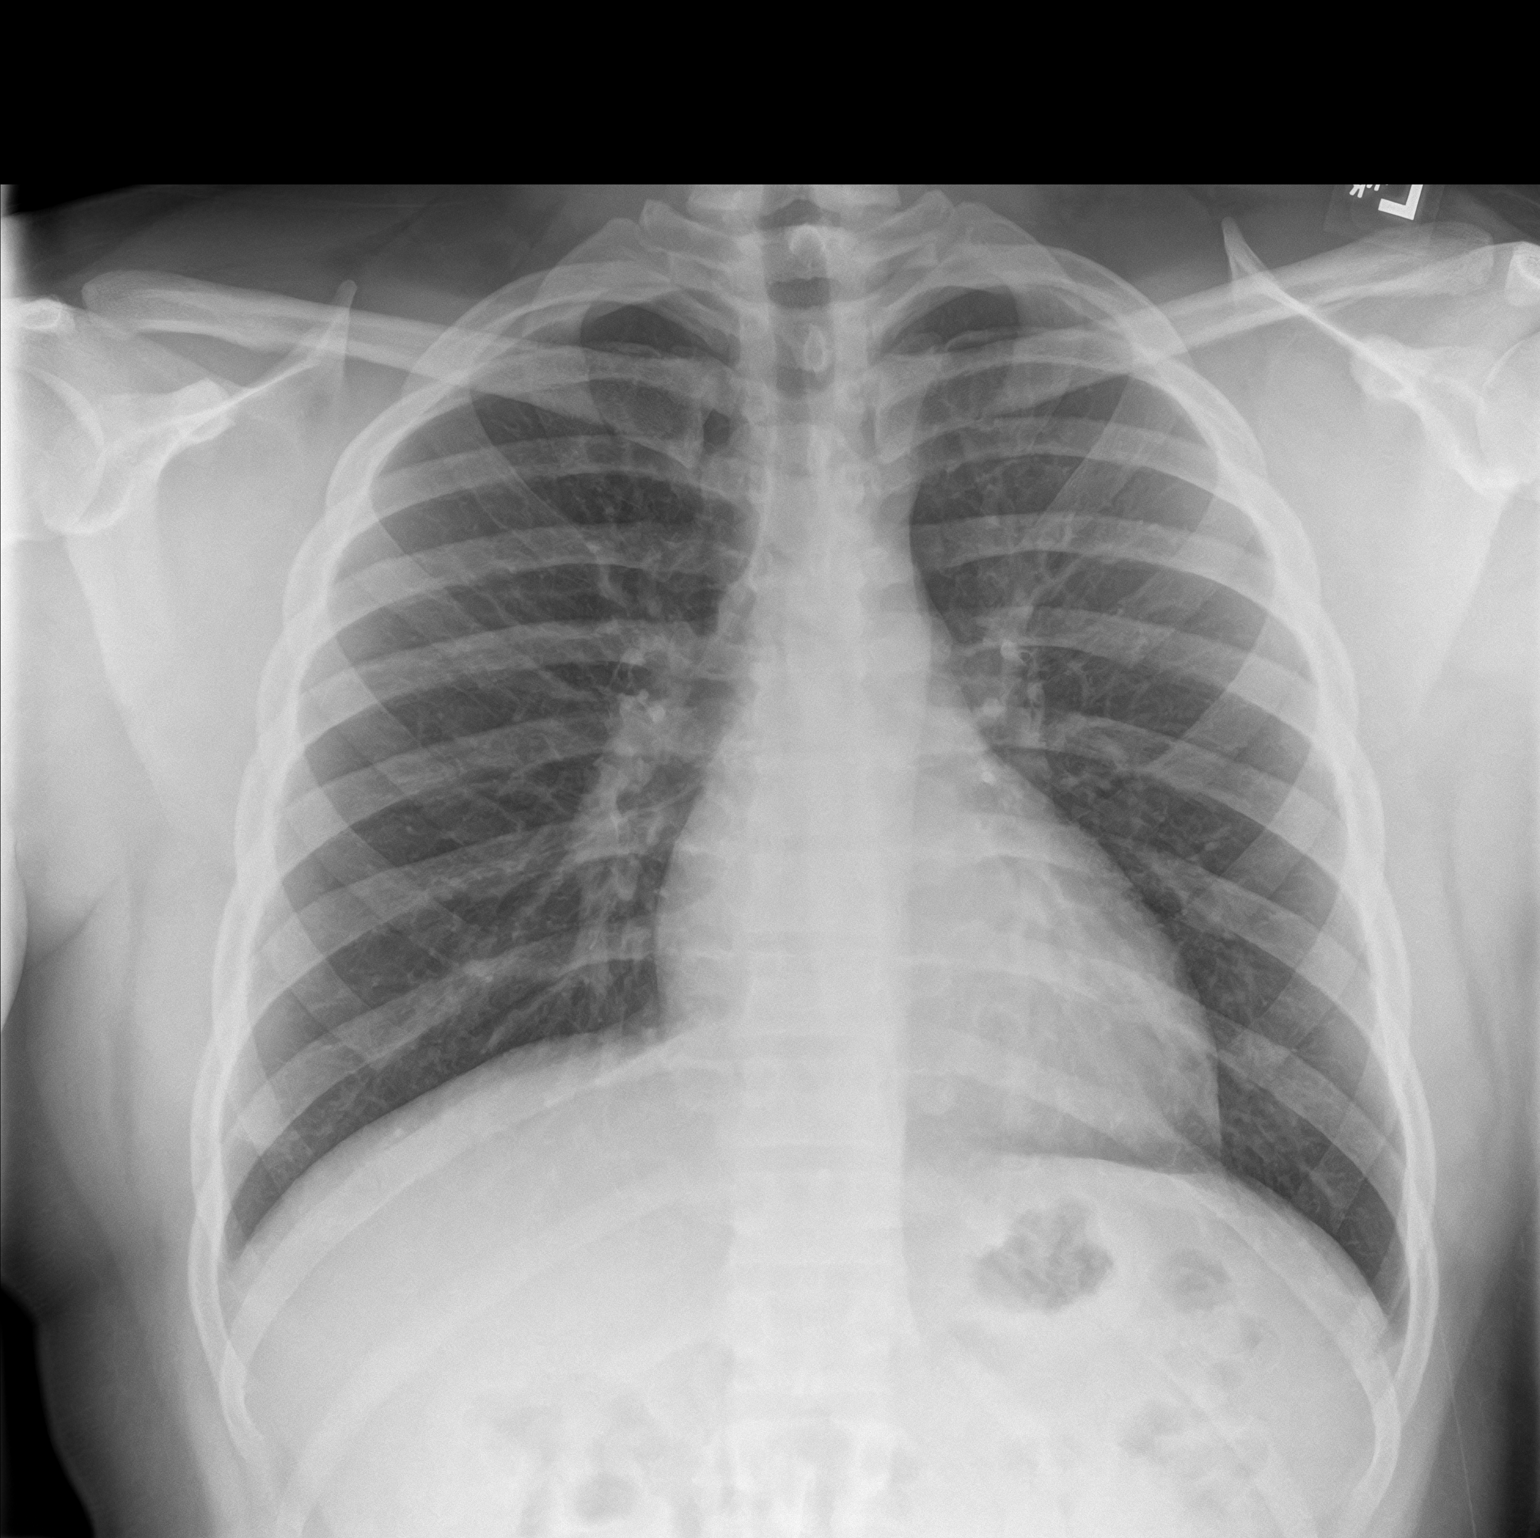
[im 2/2]
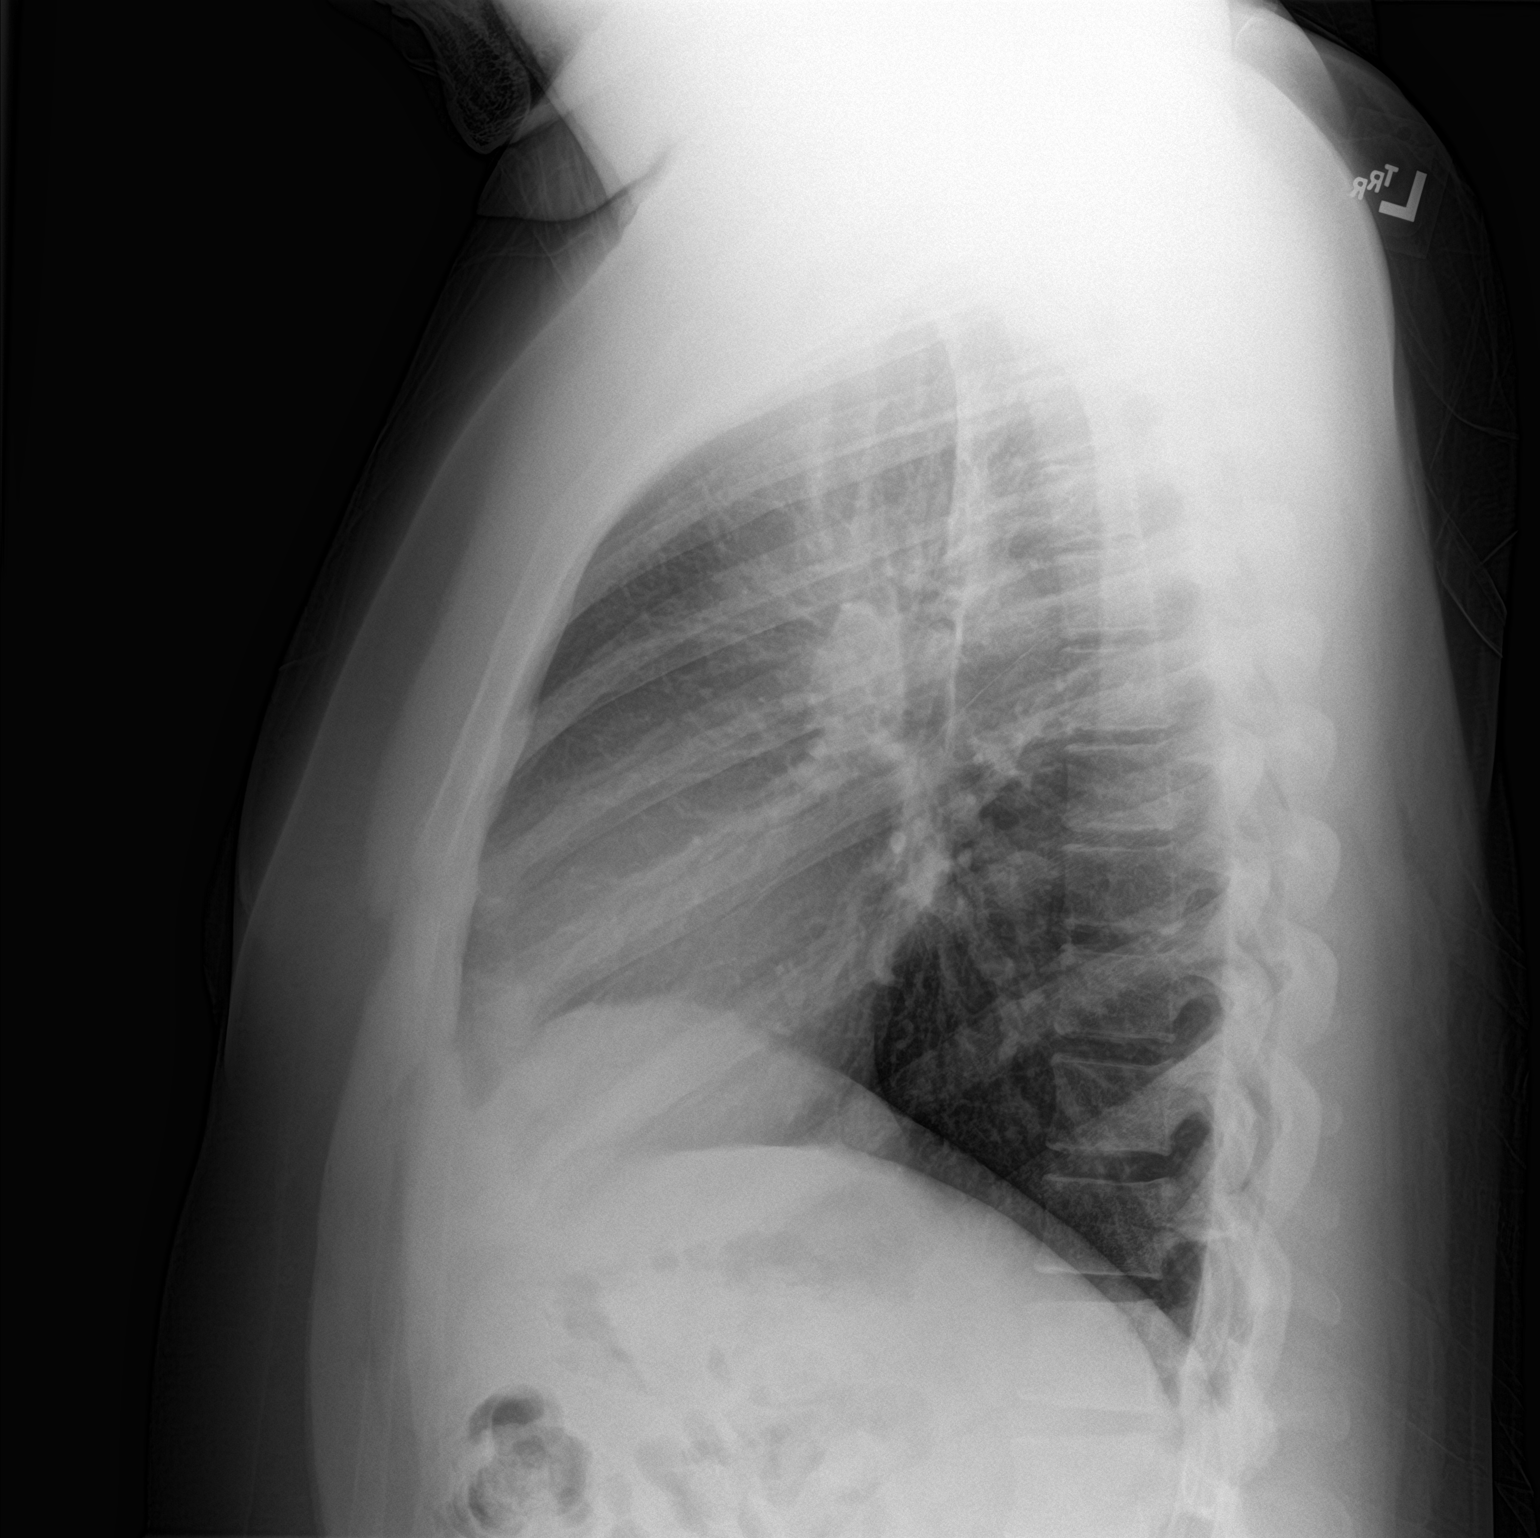

[2 of 2 positions shown; findings below may reference images not displayed]

FINDINGS: The heart size and mediastinal contours are within normal limits.
Both lungs are clear. The visualized skeletal structures are
unremarkable.
IMPRESSION: No active cardiopulmonary disease.

## 2021-11-06 IMAGING — US US ABDOMEN LIMITED
1 series · 14 of 25 positions shown · non-contrast
Comparison: None.

CLINICAL DATA: Epigastric pain for 1 week

EXAM:
ULTRASOUND ABDOMEN LIMITED RIGHT UPPER QUADRANT

[Series 1: us abdomen limited ruq · 14 of 56 slices shown]
[im 1/56]
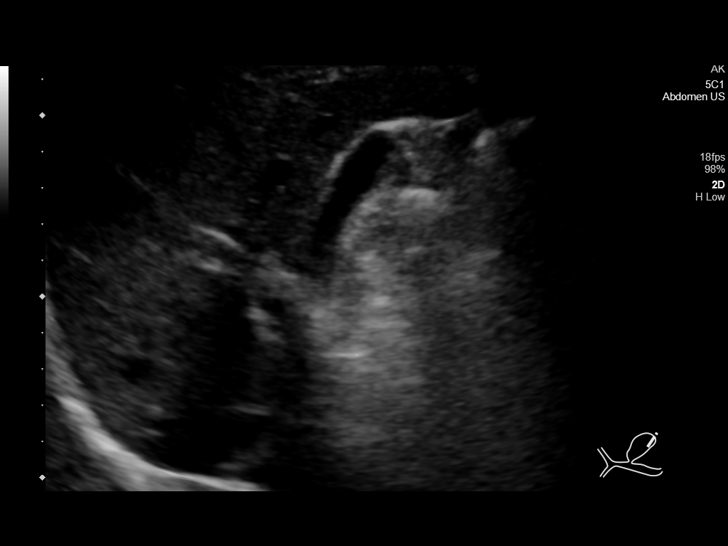
[im 5/56]
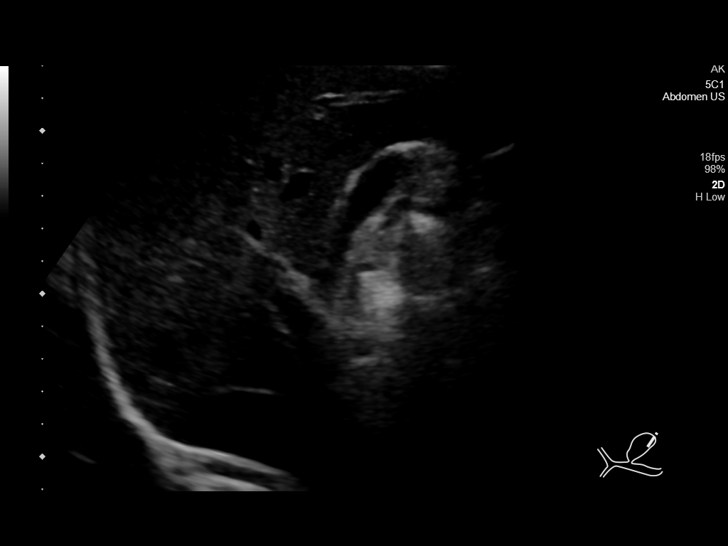
[im 10/56]
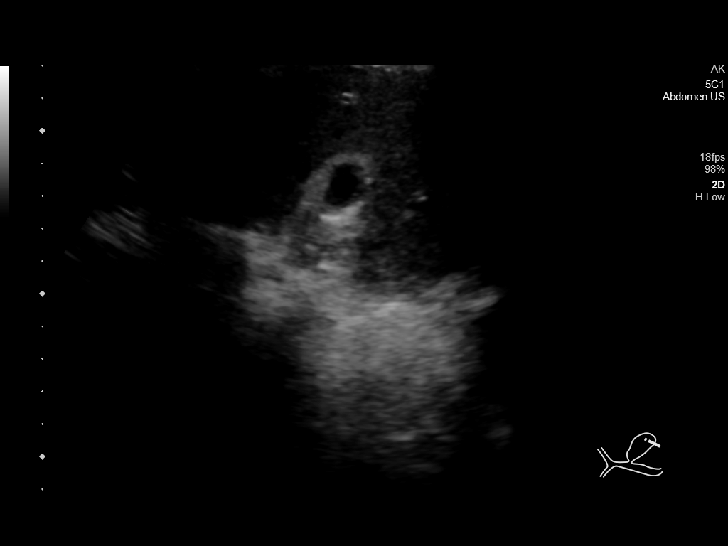
[im 14/56]
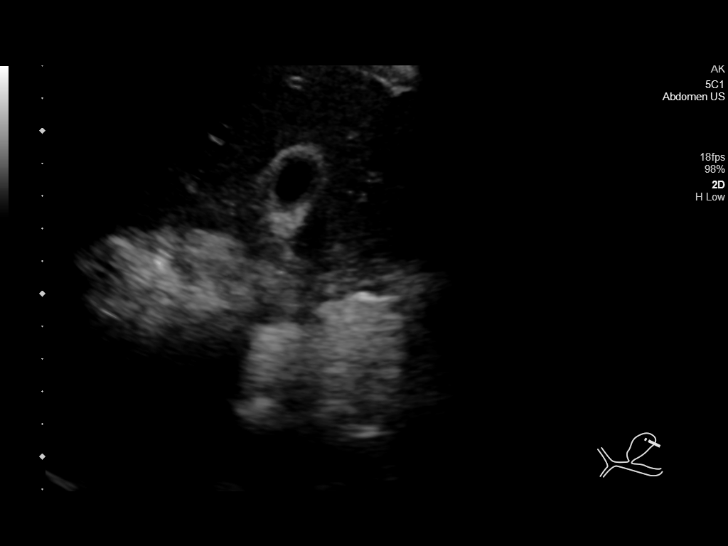
[im 19/56]
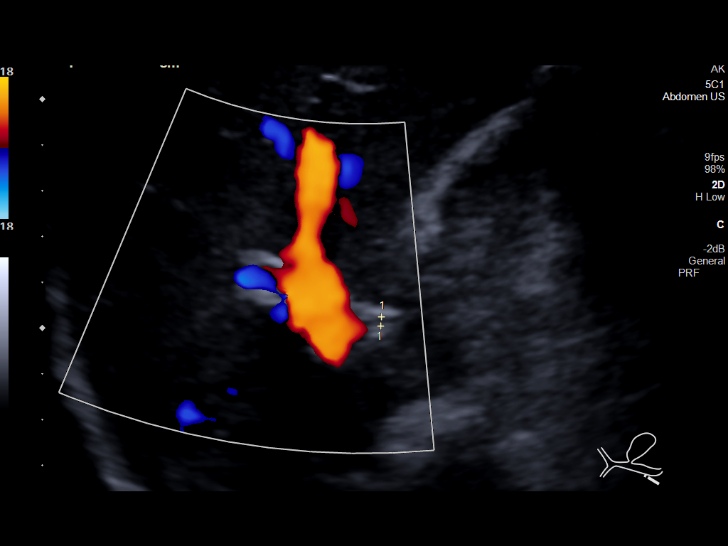
[im 21/56]
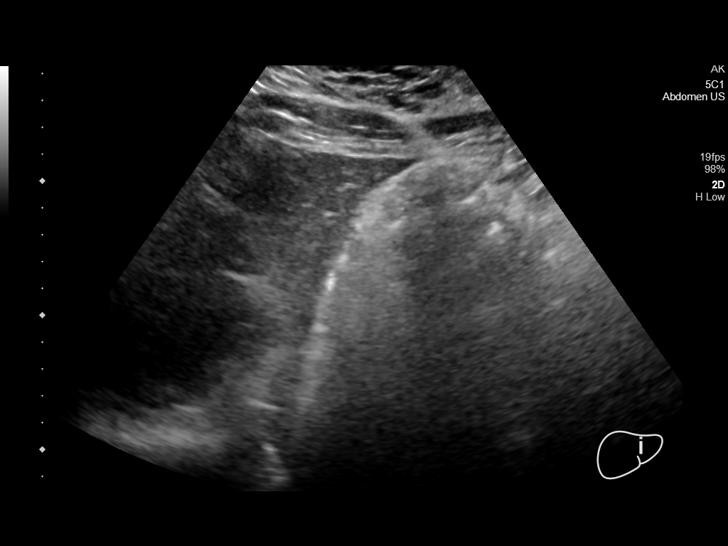
[im 26/56]
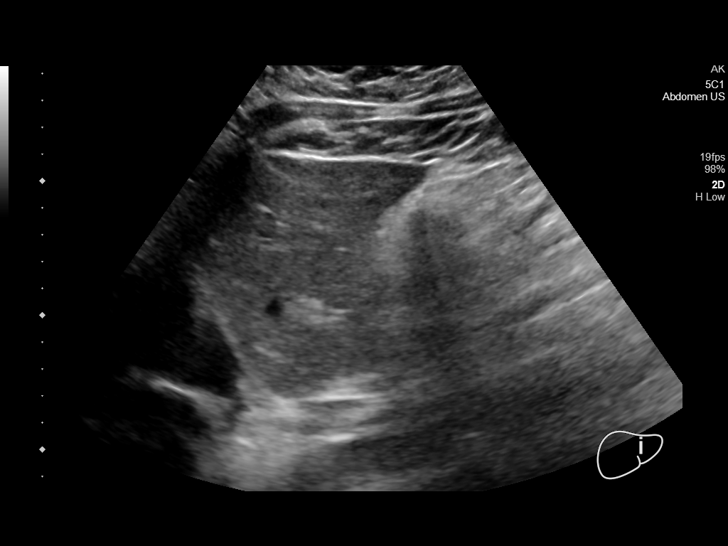
[im 30/56]
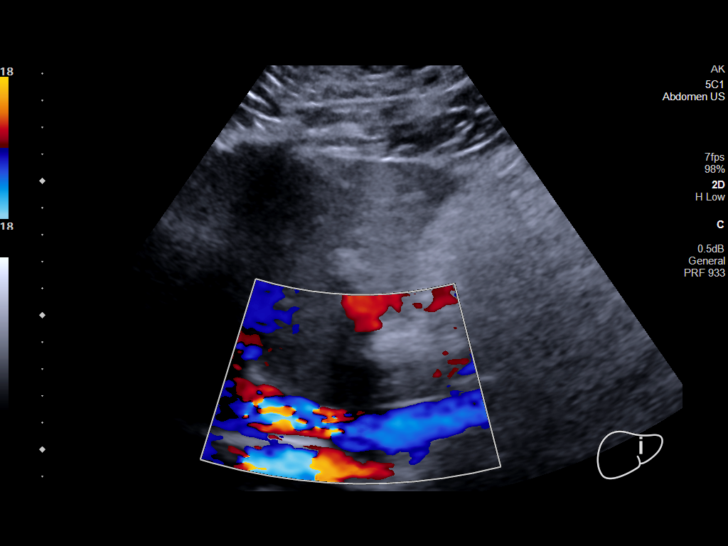
[im 35/56]
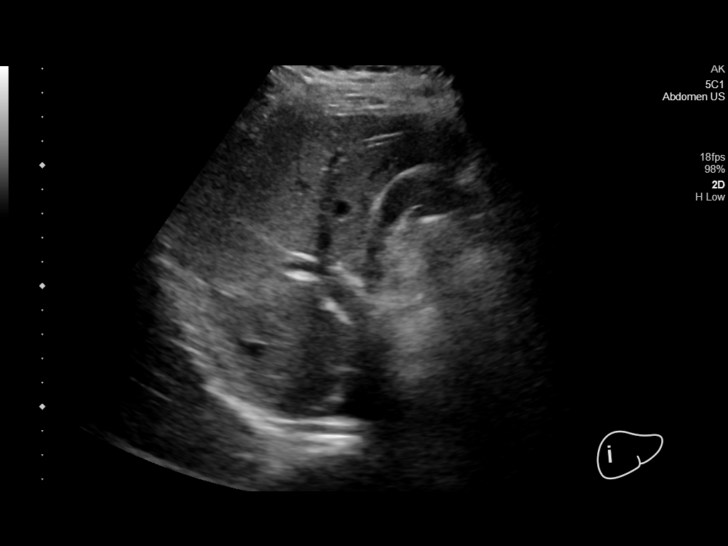
[im 37/56]
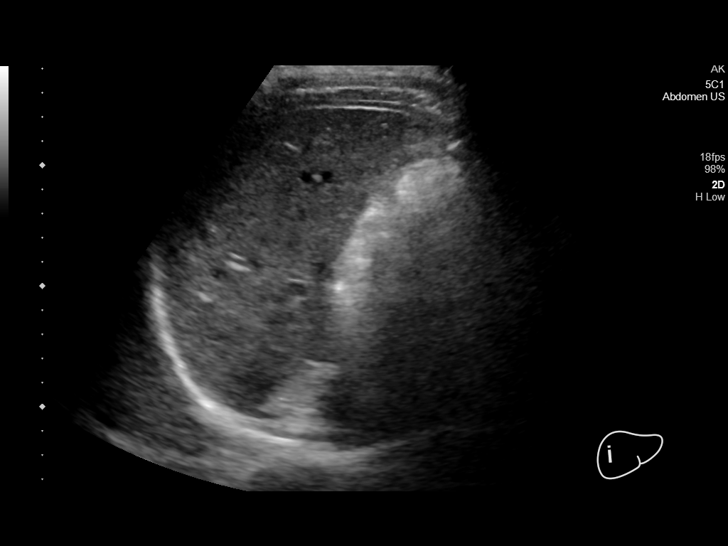
[im 42/56]
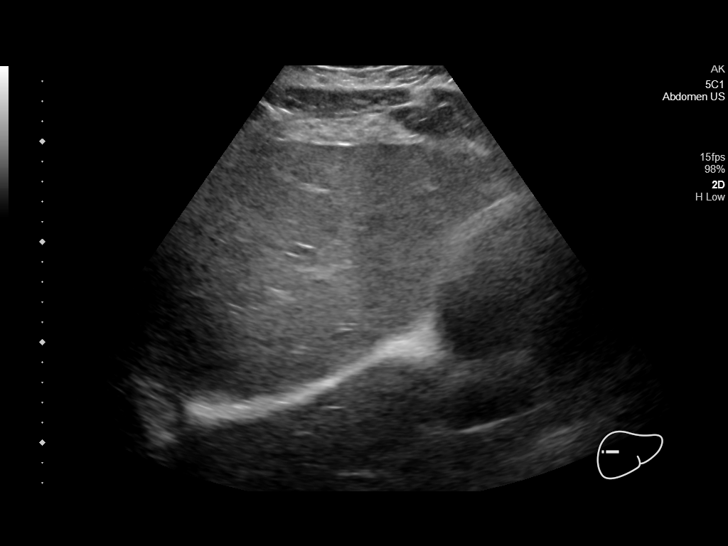
[im 46/56]
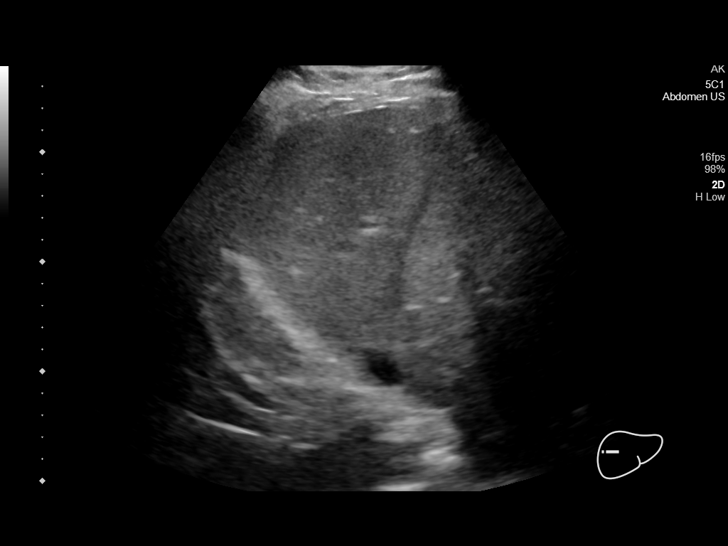
[im 51/56]
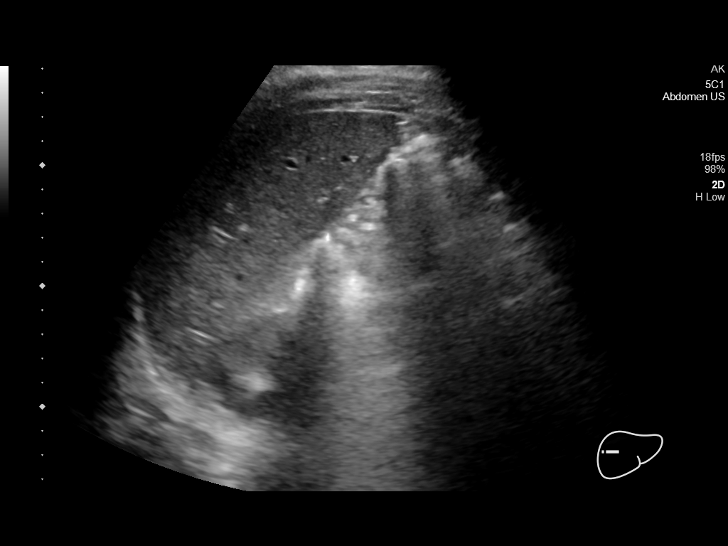
[im 56/56]
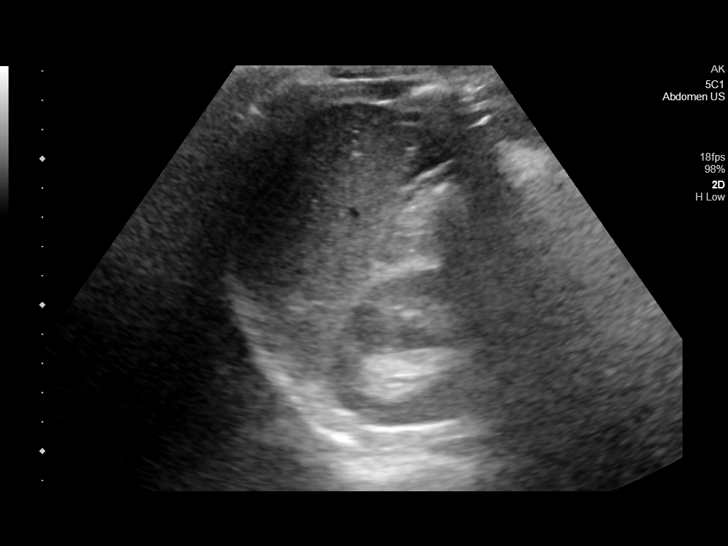

[14 of 25 positions shown; findings below may reference images not displayed]

FINDINGS: Gallbladder:

Gallstone is contracted due to recent meal by report. Wall thickness
accounting for contracted state is normal at 3 mm. No reported
tenderness over the gallbladder.

Common bile duct:

Diameter: 2 mm.

Liver:

No focal lesion identified. Within normal limits in parenchymal
echogenicity. Portal vein is patent on color Doppler imaging with
normal direction of blood flow towards the liver.

Other: Question small right pleural effusion.
IMPRESSION: Contracted gallbladder without signs of acute cholecystitis.

No evidence of biliary ductal dilation.

Question small right pleural effusion. Correlate with chest
radiography as clinically warranted.
